# Patient Record
Sex: Male | Born: 1965 | Race: White | Hispanic: No | Marital: Married | Smoking: Never smoker
Health system: Southern US, Community
[De-identification: ages and names within clinical notes are randomized; demographics above are authoritative.]

## PROBLEM LIST (undated history)

## (undated) DIAGNOSIS — I10 Essential (primary) hypertension: Secondary | ICD-10-CM

## (undated) HISTORY — PX: APPENDECTOMY: SHX54

## (undated) HISTORY — PX: TONSILLECTOMY: SUR1361

---

## 2004-05-24 ENCOUNTER — Emergency Department (HOSPITAL_COMMUNITY): Admission: EM | Admit: 2004-05-24 | Discharge: 2004-05-24 | Payer: Self-pay | Admitting: Emergency Medicine

## 2006-09-25 ENCOUNTER — Ambulatory Visit: Payer: Self-pay | Admitting: Gastroenterology

## 2006-09-25 LAB — CONVERTED CEMR LAB
AST: 22 units/L (ref 0–37)
Bilirubin, Direct: 0.1 mg/dL (ref 0.0–0.3)
Chloride: 109 meq/L (ref 96–112)
Creatinine, Ser: 1 mg/dL (ref 0.4–1.5)
Eosinophils Relative: 3.2 % (ref 0.0–5.0)
Glucose, Bld: 96 mg/dL (ref 70–99)
HCT: 38.6 % — ABNORMAL LOW (ref 39.0–52.0)
MCV: 94.4 fL (ref 78.0–100.0)
Neutrophils Relative %: 57.8 % (ref 43.0–77.0)
RBC: 4.08 M/uL — ABNORMAL LOW (ref 4.22–5.81)
RDW: 12.6 % (ref 11.5–14.6)
Sodium: 142 meq/L (ref 135–145)
TSH: 1.14 microintl units/mL (ref 0.35–5.50)
Total Bilirubin: 0.6 mg/dL (ref 0.3–1.2)
WBC: 4.4 10*3/uL — ABNORMAL LOW (ref 4.5–10.5)

## 2007-08-03 DIAGNOSIS — Z8659 Personal history of other mental and behavioral disorders: Secondary | ICD-10-CM

## 2007-08-03 DIAGNOSIS — K219 Gastro-esophageal reflux disease without esophagitis: Secondary | ICD-10-CM

## 2007-08-03 DIAGNOSIS — Z9089 Acquired absence of other organs: Secondary | ICD-10-CM

## 2010-08-23 NOTE — Assessment & Plan Note (Signed)
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   In the last 4 to 5 months Alan Hanson had a constant clearing sensation  in his throat with some hoarseness, coughing and regurgitation.  He has  not tried over-the-counter antacids or H2 blockers. There has been no  associated dysphagia or burning substernal chest pain. However, he has  had a full aching sensation in his subxyphoid area, occasionally  awakening him from sleep.  He denies any significant capillary or  biliary complaints such as clay color stools, dark urine, icterus, fever  or chills. His appetite has been poor and his eating habits are erratic  because of a separation he is going through with his wife. He does have  some mild associated depression.  He denies any specific food  intolerances.  He has not had previous medical evaluations or x-rays.  He denies abuse of cigarettes or NSAIDs but does use alcohol on  weekends.  He has never had hepatitis, pancreatitis, or other known  gastrointestinal problems.  He denies problems with alcohol abuse, has  never used IV needles.   PAST MEDICAL HISTORY:  Otherwise is entirely noncontributory except for  appendectomy when he was age 66.   FAMILY HISTORY:  Remarkable for pancreatic cancer in his grandfather.  Apparently his grandfather was in his 70s.  He is unsure if his mother  has colon polyps.   MEDICATIONS:  He takes daily multivitamins and fish oil.  He denies drug  allergies.   SOCIAL HISTORY:  The patient is separated from his wife.  He has a high  school education, works as a Barista.  Does not  smoke and uses ethanol on  weekends.   REVIEW OF SYSTEMS:  Noncontributory except for symptoms related to his  separation including depression, anxiety, and insomnia.  Associated with  his above reflux symptoms he has a chronic nonproductive cough.  He  denies dysphagia whatsoever.  He has no symptoms of Raynaud's phenomenon  or collagen vascular disease.   EXAMINATION:  He is a healthy-appearing white male in no distress.  Is  stated age.  He is 5 foot 7 and weighs 179 pounds.  Blood pressure is 128/80, pulse  was 64 and regular.  There were no stigmata of chronic liver disease.  I could appreciate  thyromegaly or lymphadenopathy.  His chest was clear and there were no murmurs, gallops rubs noted. He  was in a regular rhythm.  Examination of the oral pharynx was unremarkable.  There was no hepatosplenomegaly, abdominal masses or tenderness.  Bowel  sounds were normal.  The upper extremities were unremarkable.  Mental status was clear.   ASSESSMENT:  Alan Hanson seemed to have a hiatal hernia with  extraesophageal esophageal manifestations of gastroesophageal reflux  disease.  RECOMMENDATIONS:  1. Acid reflux regime  and movie.  2. Aciphex 20 mg twice a day 30 minutes before breakfast and supper.  3. Check abdominal ultrasound.  Exam today suggests some possible      enlargement of the left lobe of the liver.  4. Check screening laboratory parameters.  5. GI followup in 6 weeks' time.  If no clinical response he may need      endoscopic exam.     Vania Rea. Jarold Motto, MD, Caleen Essex, FAGA  Electronically Signed    DRP/MedQ  DD: 09/25/2006  DT: 09/25/2006  Job #: [id number]

## 2015-05-19 ENCOUNTER — Encounter (HOSPITAL_COMMUNITY): Payer: Self-pay | Admitting: Family Medicine

## 2015-05-19 ENCOUNTER — Emergency Department (HOSPITAL_COMMUNITY)
Admission: EM | Admit: 2015-05-19 | Discharge: 2015-05-19 | Disposition: A | Discharge status: Home / Self Care | Payer: Worker's Compensation | Attending: Emergency Medicine | Admitting: Emergency Medicine

## 2015-05-19 ENCOUNTER — Emergency Department (HOSPITAL_COMMUNITY): Payer: Worker's Compensation

## 2015-05-19 DIAGNOSIS — Y9389 Activity, other specified: Secondary | ICD-10-CM | POA: Insufficient documentation

## 2015-05-19 DIAGNOSIS — Y998 Other external cause status: Secondary | ICD-10-CM | POA: Insufficient documentation

## 2015-05-19 DIAGNOSIS — Z79899 Other long term (current) drug therapy: Secondary | ICD-10-CM | POA: Diagnosis not present

## 2015-05-19 DIAGNOSIS — S0990XA Unspecified injury of head, initial encounter: Secondary | ICD-10-CM | POA: Diagnosis not present

## 2015-05-19 DIAGNOSIS — S4991XA Unspecified injury of right shoulder and upper arm, initial encounter: Secondary | ICD-10-CM | POA: Diagnosis present

## 2015-05-19 DIAGNOSIS — I10 Essential (primary) hypertension: Secondary | ICD-10-CM | POA: Insufficient documentation

## 2015-05-19 DIAGNOSIS — S199XXA Unspecified injury of neck, initial encounter: Secondary | ICD-10-CM | POA: Insufficient documentation

## 2015-05-19 DIAGNOSIS — Y9289 Other specified places as the place of occurrence of the external cause: Secondary | ICD-10-CM | POA: Diagnosis not present

## 2015-05-19 DIAGNOSIS — M25511 Pain in right shoulder: Secondary | ICD-10-CM

## 2015-05-19 DIAGNOSIS — S60052A Contusion of left little finger without damage to nail, initial encounter: Secondary | ICD-10-CM

## 2015-05-19 HISTORY — DX: Essential (primary) hypertension: I10

## 2015-05-19 MED ORDER — METHOCARBAMOL 500 MG PO TABS: 500.0000 mg | ORAL_TABLET | Freq: Two times a day (BID) | ORAL | Status: AC

## 2015-05-19 MED ORDER — NAPROXEN 500 MG PO TABS: 500.0000 mg | ORAL_TABLET | Freq: Two times a day (BID) | ORAL | Status: AC

## 2015-05-19 MED ORDER — IBUPROFEN 800 MG PO TABS
800.0000 mg | ORAL_TABLET | Freq: Once | ORAL | Status: AC
  Administered 2015-05-19: 800 mg via ORAL
  Filled 2015-05-19: qty 1
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
## 2015-05-19 NOTE — ED Notes (Signed)
Pt off unit with CT 
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
## 2015-05-19 NOTE — ED Notes (Signed)
Pt returned from xray.  [employer] in pt room.

## 2015-05-19 NOTE — Discharge Instructions (Signed)
1. Medications: robaxin, naproxyn, usual home medications 2. Treatment: rest, drink plenty of fluids, gentle stretching, alternate ice and heat 3. Follow Up: Please followup with your primary doctor in 3-5 days for discussion of your diagnoses and further evaluation after today's visit; if you do not have a primary care doctor use the resource guide provided to find one;  Return to the ER for worsening pain, difficulty walking, vision changes, loss of bowel or bladder control or other concerning symptoms   Emergency Department Resource Guide 1) Find a Doctor and Pay Out of Pocket Although you won't have to find out who is covered by your insurance plan, it is a good idea to ask around and get recommendations. You will then need to call the office and see if the doctor you have chosen will accept you as a new patient and what types of options they offer for patients who are self-pay. Some doctors offer discounts or will set up payment plans for their patients who do not have insurance, but you will need to ask so you aren't surprised when you get to your appointment.  2) Contact Your Local Health Department Not all health departments have doctors that can see patients for sick visits, but many do, so it is worth a call to see if yours does. If you don't know where your local health department is, you can check in your phone book. The CDC also has a tool to help you locate your state's health department, and many state websites also have listings of all of their local health departments.  3) Find a Walk-in Clinic If your illness is not likely to be very severe or complicated, you may want to try a walk in clinic. These are popping up all over the country in pharmacies, drugstores, and shopping centers. They're usually staffed by nurse practitioners or physician assistants that have been trained to treat common illnesses and complaints. They're usually fairly quick and inexpensive. However, if you have  serious medical issues or chronic medical problems, these are probably not your best option.  No Primary Care Doctor: - Call Health Connect at  [phone] - they can help you locate a primary care doctor that  accepts your insurance, provides certain services, etc. - Physician Referral Service- [phone]  Chronic Pain Problems: Organization         Address  Phone   Notes  [facility]  [phone] Patients need to be referred by their primary care doctor.   Medication Assistance: Organization         Address  Phone   Notes  [facility] Med[facility] [address] [place] [place], [place] [address] [phone] --Must be a resident of [facility] -- Must have NO insurance coverage whatsoever (no Medicaid/ Medicare, etc.) -- The pt. MUST have a primary care doctor that directs their care regularly and follows them in the community   [facility]  [phone]   [employer]  [phone]    Agencies that provide inexpensive medical care: Organization         Address  Phone   Notes  [facility]  [phone]   [facility]    [phone]   [facility] [address] [place], [place] [address] [phone]   [facility] [address] [place], [place] [phone]   Planned Parenthood    [phone]   [facility]    [phone]   [facility]  [address], [place] Phone:  [phone], Fax:  [phone] Hours of Operation:  9 am - 6 pm, M-F.  Also accepts Medicaid/Medicare and self-pay.  [facility]  [address], [place] Phone: [phone], Fax: [phone]. Hours of Operation:  8:30 am - 5:30 pm, M-F.  Also accepts Medicaid and self-pay.  [facility] [place] [address], [place] Phone: [phone]   [facility] [address] Natasha Bence [place], [place] [phone], Ext. [phone] Mondays & Thursdays: 7-9 AM.  First 15 patients are seen on a first come, first serve basis.    Medicaid-accepting [facility] Providers:  Organization         Address  Phone   Notes  [facility] [address], [place] [phone] Also accepts self-pay patients.  [facility] [address] Laurell Josephs [place], [place]  [phone]   [facility] [address], [place] [phone]   [facility] [address], [place] [phone]   Renaye Rakers [address], [place]   [phone] Only accepts [place] Access [place] patients after they have their name applied to their card.   Self-Pay (no insurance) in [facility]:  Organization         Address  Phone   Notes  Sickle Cell Patients, [facility] [address] [place], [place] [phone]   [facility] [address] [place], [place] [phone]   [facility]  [address], [place] [phone]   [facility]/Dr. Osei-Bonsu  [address] [place] or [address], [place] [phone] Phone number for both [place] and [place] locations is the same.  [facility] [address] [place] [phone]   [facility] [address] [place] or [address] [phone]   [facility] [address] [place] [phone], phone; [phone], fax Sees patients 1st and 3rd Saturday of every month.  Must not qualify for public or private insurance (i.e. Medicaid, Medicare, [place] Health Choice, Veterans' Benefits)  Household income should be no more than 200% of the poverty level The clinic cannot treat you if you are pregnant or think you are pregnant   Sexually transmitted diseases are not treated at the clinic.    Dental Care: Organization         Address  Phone  Notes  [facility] Department of [facility] [address] [place], [place] [phone] Accepts children up to age 107 who are enrolled in [place] or [place] Health Choice; pregnant women with a Medicaid card; and children who have applied for Medicaid or [place] Health Choice, but were declined, whose parents can pay a reduced fee at time of service.  [facility]  [address], [place] [phone] Accepts children up to age 70 who are enrolled in [place] or [place] Health Choice; pregnant women with a Medicaid card; and children who have applied for Medicaid or  Health Choice, but were declined, whose parents can pay a reduced fee at time of service.  [place] [facility] Access PROGRAM  [place] [phone] Patients are seen by appointment only. Walk-ins are not accepted. [place] will see patients 13 years of age and older. Monday - Tuesday (8am-5pm) Most Wednesdays (8:30-5pm) $30 per visit, cash only  [facility] Adult Dental Access PROGRAM  [address], [facility] [phone] Patients are seen by appointment only. Walk-ins are not accepted. [place] will see patients 35 years of age and older. One Wednesday Evening (Monthly: Volunteer Based).  $30 per visit, cash only  [place]  [phone] for adults; Children under age 6, call Graduate Pediatric Dentistry at [phone]. Children aged 37-14, please call [phone] to request a pediatric application.  Dental services are provided in all areas of dental care including fillings, crowns and bridges, complete and partial dentures, implants, gum treatment, root canals, and extractions. Preventive care is also provided. Treatment is provided to both adults and children. Patients  are selected via a lottery and there is often a waiting list.   [facility] [address] [place]  [phone] [email or web address]   [facility] [address] [place], [place] [phone], Ext. [phone] Second and Fourth Thursday of each month, opens at 6:30 AM; Clinic ends at 9 AM.  Patients are seen on a first-come first-served basis, and a limited number are seen during each clinic.   [facility]  [address] Hillard Danker [place], [place] [phone]   Eligibility Requirements You must have lived in [place], [place], or [place] counties for at least the last three months.   You cannot be eligible for state or federal sponsored [employer], including [employer], [place], or [employer].   You generally cannot be eligible for healthcare insurance through your employer.    How to apply: Eligibility screenings are held every Tuesday and Wednesday afternoon from 1:00 pm until 4:00 pm. You do not need an appointment for the interview!  [facility] [address], [place], [place]   [place]  [place] Department  [place]  [phone]    Behavioral Health Resources in the Community: Intensive Outpatient Programs Organization         Address  Phone  Notes  [facility] [place]. [address], [place], [place] [phone]   [facility] [address] [place], [place]   [facility]: [facility] [address], [place], [place]   [place] [address]  [place], [place] or [phone]   Substance Abuse Resources Organization         Address  Phone  Notes  [facility] Drug Services  [phone]   [place]  [phone]   [facility] [place]   Chinita Pester  [phone]    Residential & Outpatient Substance Abuse Program  [phone]   Psychological Services Organization         Address  Phone  Notes  [facility] [place]  [place]  [phone]   [place] [address] [place] [phone] or [phone]    Mobile Crisis Teams Organization         Address  Phone  Notes  [facility], Mobile Crisis Care Unit  [phone]   [facility]  [address] Gin[place], [place] [phone]   [facility] [address] [place] [phone]    Self-Help/Support Groups Organization         Address  Phone             Notes  [facility] [place] - variety of support groups  336- I7437963 Call for more information  Narcotics Anonymous (NA), [facility] [address], [employer] [place]  2 meetings at this location   Statistician         Address  Phone  Notes  [facility] [address],    [place]  [phone]   [facility]  [address], [place] [address], [place], [place] [phone]   [facility] [address] [place], [place] [phone] Admissions: 8am-3pm M-F  [facility] [address],    [place], [place] [phone]   [facility] [address] [place], [place], [place] [phone]   [facility] [address]  [place], [place] [phone]   [facility] - Intensive Outpatient [address] Laurell Josephs [address], [place], [place] [phone]   [facility] ([facility] [address] [place].,  [place], [place] [phone] or [phone]   [facility] ([facility]) [address], [place], [place] [phone] Accepts Medicaid  [facility] [place] [address],  [place] [phone] Substance Abuse/Addiction Treatment   [facility] Organization         Address  Phone  Notes  [facility]  [phone]   Angie Fava, PhD [address] Ervin Knack [place], [place]   [phone] or [phone]   [facility]   [address] [place], [place] [phone]   [facility] [address], [place], [place] [phone] Insurance/Medicaid/sponsorship through [facility] and Families [address]                                    [place], [place] [phone] Therapy/tele-psych/case  [facility] [address] [place] [address], [place] [phone]    Dr. Lolly Mustache  [phone]   [facility]  [employer] [facility] Dept. 1) [address], [place] 2) [address] [place] 3)  [address], [place] [phone]  [phone]   [facility] Child Abuse Hotline 857-379-1584 or [phone] (After Hours)

## 2016-09-01 IMAGING — CR DG FINGER LITTLE 2+V*L*
3 series · 3 of 3 positions shown · non-contrast
Comparison: None.

CLINICAL DATA: Trauma

EXAM:
LEFT LITTLE FINGER 2+V

[finger ap]
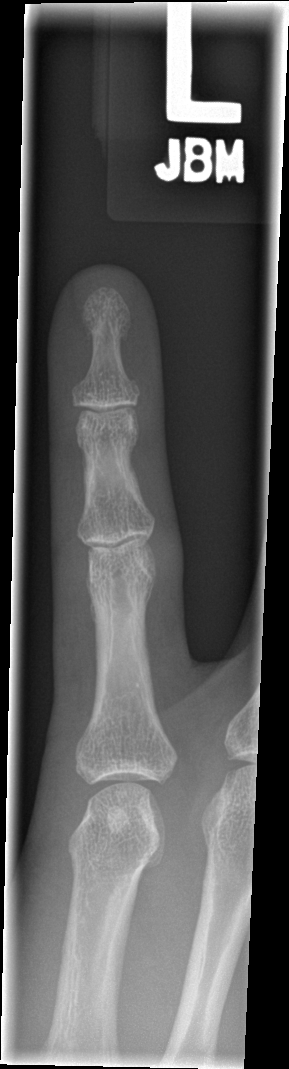

[finger obl]
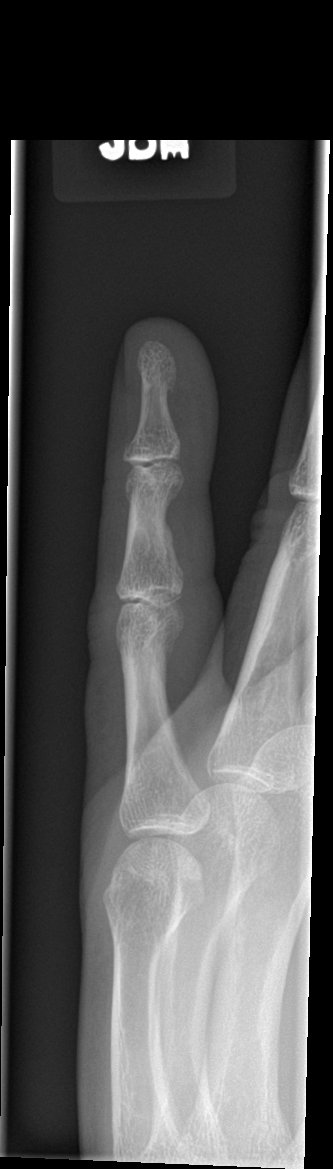

[finger lat]
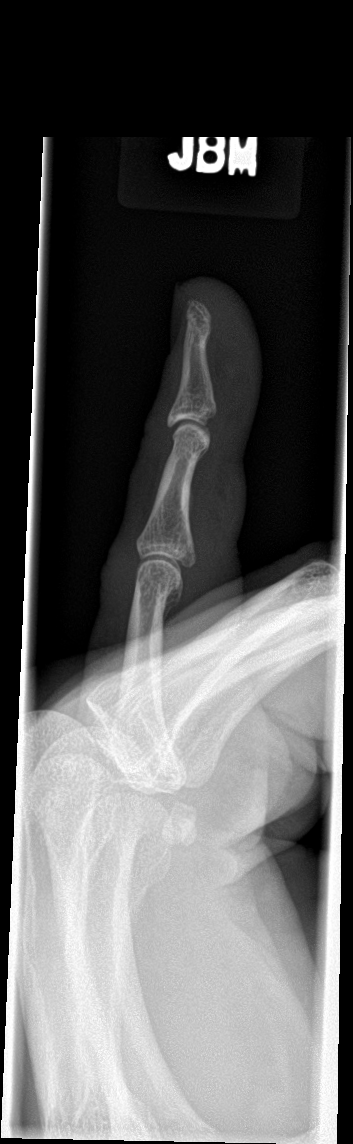

[3 of 3 positions shown; findings below may reference images not displayed]

FINDINGS: No fracture or dislocation is seen.

The joint spaces are preserved.

Visualized soft tissues are within normal limits.
IMPRESSION: No fracture or dislocation is seen.

## 2022-07-21 ENCOUNTER — Other Ambulatory Visit (INDEPENDENT_AMBULATORY_CARE_PROVIDER_SITE_OTHER): Payer: BC Managed Care – PPO

## 2022-07-21 ENCOUNTER — Ambulatory Visit: Payer: BC Managed Care – PPO | Admitting: Surgical

## 2022-07-21 DIAGNOSIS — M25562 Pain in left knee: Secondary | ICD-10-CM

## 2022-07-21 DIAGNOSIS — M25462 Effusion, left knee: Secondary | ICD-10-CM

## 2022-07-23 ENCOUNTER — Encounter: Payer: Self-pay | Admitting: Surgical

## 2022-07-23 NOTE — Progress Notes (Signed)
Office Visit Note   Patient: Alan Hanson           Date of Birth: 01-29-1966           MRN: [id number] Visit Date: 07/21/2022 Requested by: No referring provider defined for this encounter. PCP: Pcp, No  Subjective: Chief Complaint  Patient presents with   Left Knee - Pain    HPI: Alan Hanson is a 57 y.o. male who presents to the office reporting left knee pain.  Patient reports that he has had increased left knee pain for about a year overall but it has been significantly worse in the last 2 weeks.  He had an incident where he jumped off of a tailgate about a year ago and noticed increased left knee pain since then.  This became acutely worse 13 days ago when patient stepped off of a curb and felt a pop sensation in the medial aspect of the knee.  Since then it has been hard to weight-bear.  He localizes pain to the medial and posterior medial aspect of the knee.  No history of prior surgery to the knee.  He feels the knee wants to give out on him at times.  He has never had any sort of injection or physical therapy.  He saw his PCP who recommended injection but he would like to hold off on this and figure out the source of his pain prior to any intervention.  He denies any groin pain, radicular pain, numbness/tingling.  Has difficulty especially with stairs and going up inclines.  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  Additionally, he enjoys hiking as a hobby..                ROS: All systems reviewed are negative as they relate to the chief complaint within the history of present illness.  Patient denies fevers or chills.  Assessment & Plan: Visit Diagnoses:  1. Effusion, left knee   2. Left knee pain, unspecified chronicity   3. Medial joint line tenderness of knee, left     Plan: Patient is a 57 year old male who complains of left knee pain.  Initially had onset of knee pain about a year ago after jumping off a tailgate which  has been exacerbated in the last 2 weeks after he stepped off of a curb and felt a popping sensation in the medial aspect of the knee.  He is ambulating with a moderate amount of antalgia and has to walk with the assistance of a single crutch.  He has reproducible pain in the medial aspect of the knee with positive joint line tenderness and positive McMurray's sign.  Positive effusion on exam.  Left knee radiographs demonstrate no significant osteoarthritis or any osseous abnormality to explain his pain.  Plan for further evaluation of medial meniscal tear with MRI of the left knee.  Follow-up after MRI to review results.  Follow-Up Instructions: No follow-ups on file.   Orders:  Orders Placed This Encounter  Procedures   XR KNEE 3 VIEW LEFT   MR Knee Left w/o contrast   No orders of the defined types were placed in this encounter.     Procedures: No procedures performed   Clinical Data: No additional findings.  Objective: Vital Signs: There were no vitals taken for this visit.  Physical Exam:  Constitutional: Patient appears well-developed HEENT:  Head: Normocephalic Eyes:EOM are normal Neck: Normal range of motion Cardiovascular: Normal  rate Pulmonary/chest: Effort normal Neurologic: Patient is alert Skin: Skin is warm Psychiatric: Patient has normal mood and affect  Ortho Exam: Ortho exam demonstrates left knee with small to moderate effusion.  Right knee with no effusion.  Left knee with tenderness over the medial joint line and positive McMurray's sign reproducing medial sided pain.  He ambulates with moderate antalgia with the use of a single crutch for support.  Able to perform straight leg raise without extensor lag.  Stable to varus and valgus stress at 0 and 30 degrees.  Stable to anterior and posterior drawer sign.  He has no pain with hip range of motion.  No cellulitis or skin changes noted.  No calf tenderness.  Negative Homans' sign.  Intact ankle dorsiflexion and  plantarflexion.  No tenderness over the quadricep tendon, patellar tendon, patella.  No tenderness over the lateral joint line.  Specialty Comments:  No specialty comments available.  Imaging: No results found.   PMFS History: Patient Active Problem List   Diagnosis Date Noted   ESOPHAGEAL REFLUX 08/03/2007   DEPRESSION, HX OF 08/03/2007   APPENDECTOMY, HX OF 08/03/2007   Past Medical History:  Diagnosis Date   Hypertension     No family history on file.  Past Surgical History:  Procedure Laterality Date   APPENDECTOMY     TONSILLECTOMY     Social History   Occupational History   Not on file  Tobacco Use   Smoking status: Never   Smokeless tobacco: Not on file  Substance and Sexual Activity   Alcohol use: Yes   Drug use: Not on file   Sexual activity: Not on file

## 2022-07-25 ENCOUNTER — Telehealth: Payer: Self-pay | Admitting: Orthopedic Surgery

## 2022-07-25 NOTE — Telephone Encounter (Signed)
[facility] asking for a copy of the MRI order for approval--please sent to fax [phone] just's need patient demographics and order.

## 2022-07-25 NOTE — Telephone Encounter (Signed)
Faxed order over to [facility]. Pending appt

## 2022-07-31 ENCOUNTER — Telehealth: Payer: Self-pay | Admitting: Orthopedic Surgery

## 2022-07-31 NOTE — Telephone Encounter (Signed)
Patient called asked if he can get a call back with the results of his MRI? The number to contact patient is [phone]

## 2022-08-01 ENCOUNTER — Ambulatory Visit: Payer: BC Managed Care – PPO

## 2022-08-01 DIAGNOSIS — M25462 Effusion, left knee: Secondary | ICD-10-CM

## 2022-08-01 DIAGNOSIS — M25562 Pain in left knee: Secondary | ICD-10-CM

## 2022-08-01 NOTE — Telephone Encounter (Signed)
I called patient to discuss results.  He has subchondral fracture of the medial femoral condyle.  Recommended nonweightbearing.  He has crutches at home.  Recommended he come into the office for [place] to check his vitamin D level.  He will do this.  He is following up with Dr. August Saucer on 08/16/2022.  I will call him with results from lab work.

## 2022-08-02 LAB — VITAMIN D 25 HYDROXY (VIT D DEFICIENCY, FRACTURES): Vit D, 25-Hydroxy: 13 ng/mL — ABNORMAL LOW (ref 30–100)

## 2022-08-04 ENCOUNTER — Other Ambulatory Visit: Payer: Self-pay | Admitting: Surgical

## 2022-08-04 MED ORDER — VITAMIN D (ERGOCALCIFEROL) 1.25 MG (50000 UNIT) PO CAPS: 50000.0000 [IU] | ORAL_CAPSULE | ORAL | 0 refills | Status: AC

## 2022-08-04 NOTE — Progress Notes (Signed)
I called and informed patient of lab results.  Sent in vitamin D supplement to take once weekly.  He will follow-up with Dr. August Saucer in a couple weeks.

## 2022-08-16 ENCOUNTER — Ambulatory Visit (INDEPENDENT_AMBULATORY_CARE_PROVIDER_SITE_OTHER): Payer: BC Managed Care – PPO | Admitting: Orthopedic Surgery

## 2022-08-16 ENCOUNTER — Encounter: Payer: Self-pay | Admitting: Orthopedic Surgery

## 2022-08-16 DIAGNOSIS — M25462 Effusion, left knee: Secondary | ICD-10-CM

## 2022-08-16 NOTE — Progress Notes (Signed)
   Office Visit Note   Patient: Alan Hanson           Date of Birth: May 02, 1965           MRN: [id number] Visit Date: 08/16/2022 Requested by: No referring provider defined for this encounter. PCP: Pcp, No  Subjective: Chief Complaint  Patient presents with   Other     Scan review    HPI: Alan Hanson is a 57 y.o. male who presents to the office reporting left knee pain.  Did have a injury when he was stepping off a curb.  Affected his left knee.  MRI scan showed small subchondral impaction fracture.  His vitamin D level was 13.  He has been on supplementation.  Wants to minimize his needlesticks.  Overall he feels better than he did last visit..                ROS: All systems reviewed are negative as they relate to the chief complaint within the history of present illness.  Patient denies fevers or chills.  Assessment & Plan: Visit Diagnoses:  1. Effusion, left knee     Plan: Impression is subchondral impaction fracture and patient has low vitamin D.  He has been nonweightbearing.  I think he is good to be partial weightbearing with crutches and it is demonstrated for him today.  50% through the leg 50% through the arms.  Range of motion as tolerated.  No effusion today which is encouraging.  I want him to be 50% weightbearing for 2 weeks then weightbearing as tolerated for the next week and we will see him back in 3 weeks for clinical recheck.  [facility]  We will need to recheck his vitamin D level at sometime in the future  Follow-Up Instructions: No follow-ups on file.   Orders:  No orders of the defined types were placed in this encounter.  No orders of the defined types were placed in this encounter.     Procedures: No procedures performed   Clinical Data: No additional findings.  Objective: Vital Signs: There were no vitals taken for this visit.  Physical Exam:  Constitutional: Patient appears  well-developed HEENT:  Head: Normocephalic Eyes:EOM are normal Neck: Normal range of motion Cardiovascular: Normal rate Pulmonary/chest: Effort normal Neurologic: Patient is alert Skin: Skin is warm Psychiatric: Patient has normal mood and affect  Ortho Exam: Ortho exam demonstrates full range of motion the left knee.  No effusion.  Does have some tenderness at that anterior medial femoral condyle.  Collaterals are stable.  Pedal pulses palpable.  No calf tenderness and negative Homans.  Specialty Comments:  No specialty comments available.  Imaging: No results found.   PMFS History: Patient Active Problem List   Diagnosis Date Noted   ESOPHAGEAL REFLUX 08/03/2007   DEPRESSION, HX OF 08/03/2007   APPENDECTOMY, HX OF 08/03/2007   Past Medical History:  Diagnosis Date   Hypertension     No family history on file.  Past Surgical History:  Procedure Laterality Date   APPENDECTOMY     TONSILLECTOMY     Social History   Occupational History   Not on file  Tobacco Use   Smoking status: Never   Smokeless tobacco: Not on file  Substance and Sexual Activity   Alcohol use: Yes   Drug use: Not on file   Sexual activity: Not on file

## 2022-09-27 ENCOUNTER — Ambulatory Visit (INDEPENDENT_AMBULATORY_CARE_PROVIDER_SITE_OTHER): Payer: BC Managed Care – PPO | Admitting: Surgical

## 2022-09-27 DIAGNOSIS — M25562 Pain in left knee: Secondary | ICD-10-CM | POA: Diagnosis not present

## 2022-10-01 ENCOUNTER — Encounter: Payer: Self-pay | Admitting: Surgical

## 2022-10-01 NOTE — Progress Notes (Signed)
Follow-up Office Visit Note   Patient: Alan Hanson           Date of Birth: [birth year]           MRN: [id number] Visit Date: 09/27/2022 Requested by: No referring provider defined for this encounter. PCP: Pcp, No  Subjective: Chief Complaint  Patient presents with   Left Knee - Follow-up    HPI: Alan Hanson is a 57 y.o. male who returns to the office for follow-up visit.    Plan at last visit was: Impression is subchondral impaction fracture and patient has low vitamin D.  He has been nonweightbearing.  I think he is good to be partial weightbearing with crutches and it is demonstrated for him today.  50% through the leg 50% through the arms.  Range of motion as tolerated.  No effusion today which is encouraging.  I want him to be 50% weightbearing for 2 weeks then weightbearing as tolerated for the next week and we will see him back in 3 weeks for clinical recheck.  His primary care physician is [facility].  We will need to recheck his vitamin D level at sometime in the future   Since then, patient notes he is doing well overall.  Has some discomfort from time to time with occasional swelling based on activity level.  Has a little bit of issue with balance on occasion but he has returned to full weightbearing without crutches.  Pain does not wake him up from sleep any longer.  Sometimes feels like he has a popping sensation in the medial aspect of the knee but this is only intermittent and mostly feels like it "wants to pop" rather than actual popping.  Not taking any medications for pain.  Still taking vitamin D every week but he is finishing up this course.  He stopped using crutches 3 weeks ago.  Has been able to walk half a mile but this comes with some swelling and some recurrence of pain after wearing that this is getting better and better.              ROS: All systems reviewed are negative as they relate to the chief complaint within the history of present  illness.  Patient denies fevers or chills.  Assessment & Plan: Visit Diagnoses:  1. Left knee pain, unspecified chronicity     Plan: Alan Hanson is a 57 y.o. male who returns to the office for follow-up visit for left knee pain.  Plan from last visit was noted above in HPI.  They now return with significant improvement in the left knee pain following period of nonweightbearing to partial weightbearing for a subchondral impaction fracture of the medial femoral condyle.  Feels that the vitamin D has been very helpful along with the nonweightbearing period.  No knee effusion is present.  With patient feeling that he is significantly improving every week, recommended that he take a vitamin D supplementation long-term.  He will have his yearly labs checked by his PCP in the next several weeks and send [place] what the vitamin D level is on MyChart and we can recommend the best vitamin D supplement for him as far as how many units to take.  Follow-up with the office as needed if he has any continued pain/swelling but highly recommended that he hold off on any hiking for at least another 4 to 6 weeks and slowly reintroduce this activity with easy trails first.  Follow-Up Instructions: No  follow-ups on file.   Orders:  No orders of the defined types were placed in this encounter.  No orders of the defined types were placed in this encounter.     Procedures: No procedures performed   Clinical Data: No additional findings.  Objective: Vital Signs: There were no vitals taken for this visit.  Physical Exam:  Constitutional: Patient appears well-developed HEENT:  Head: Normocephalic Eyes:EOM are normal Neck: Normal range of motion Cardiovascular: Normal rate Pulmonary/chest: Effort normal Neurologic: Patient is alert Skin: Skin is warm Psychiatric: Patient has normal mood and affect  Ortho Exam: Ortho exam demonstrates left knee with no effusion.  Minimal tenderness over the weightbearing  portion of the medial femoral condyle with the knee in a hyperflexed position.  No tenderness over the lateral condyle or lateral joint line.  No pain with hip range of motion.  He is able to perform straight leg raise with excellent quad strength.  Range of motion from 0 degrees extension to greater than 90 degrees of knee flexion.  No calf tenderness.  Negative Homans' sign.  No cellulitis or skin changes noted.  Specialty Comments:  No specialty comments available.  Imaging: No results found.   PMFS History: Patient Active Problem List   Diagnosis Date Noted   ESOPHAGEAL REFLUX 08/03/2007   DEPRESSION, HX OF 08/03/2007   APPENDECTOMY, HX OF 08/03/2007   Past Medical History:  Diagnosis Date   Hypertension     No family history on file.  Past Surgical History:  Procedure Laterality Date   APPENDECTOMY     TONSILLECTOMY     Social History   Occupational History   Not on file  Tobacco Use   Smoking status: Never   Smokeless tobacco: Not on file  Substance and Sexual Activity   Alcohol use: Yes   Drug use: Not on file   Sexual activity: Not on file
